# Patient Record
Sex: Male | Born: 1965 | Race: Black or African American | Hispanic: No | Marital: Married | Smoking: Current every day smoker
Health system: Southern US, Community
[De-identification: ages and names within clinical notes are randomized; demographics above are authoritative.]

---

## 2016-06-24 ENCOUNTER — Other Ambulatory Visit: Payer: Self-pay | Admitting: Family Medicine

## 2016-06-24 ENCOUNTER — Ambulatory Visit
Admission: RE | Admit: 2016-06-24 | Discharge: 2016-06-24 | Disposition: A | Discharge status: Home / Self Care | Payer: 59 | Source: Ambulatory Visit | Attending: Family Medicine | Admitting: Family Medicine

## 2016-06-24 DIAGNOSIS — M79604 Pain in right leg: Secondary | ICD-10-CM | POA: Diagnosis present

## 2016-06-24 DIAGNOSIS — M79605 Pain in left leg: Secondary | ICD-10-CM | POA: Diagnosis not present

## 2020-02-03 ENCOUNTER — Ambulatory Visit: Payer: 59

## 2022-01-31 ENCOUNTER — Other Ambulatory Visit: Payer: Self-pay | Admitting: *Deleted

## 2022-01-31 DIAGNOSIS — Z87891 Personal history of nicotine dependence: Secondary | ICD-10-CM

## 2022-01-31 DIAGNOSIS — F17210 Nicotine dependence, cigarettes, uncomplicated: Secondary | ICD-10-CM

## 2022-02-18 ENCOUNTER — Ambulatory Visit (INDEPENDENT_AMBULATORY_CARE_PROVIDER_SITE_OTHER): Payer: BC Managed Care – PPO | Admitting: Acute Care

## 2022-02-18 ENCOUNTER — Encounter: Payer: Self-pay | Admitting: Acute Care

## 2022-02-18 DIAGNOSIS — F17210 Nicotine dependence, cigarettes, uncomplicated: Secondary | ICD-10-CM | POA: Diagnosis not present

## 2022-02-18 NOTE — Patient Instructions (Signed)
Thank you for participating in the [facility]. It was our pleasure to meet you today. We will call you with the results of your scan within the next few days. Your scan will be assigned a Lung RADS category score by the physicians reading the scans.  This Lung RADS score determines follow up scanning.  See below for description of categories, and follow up screening recommendations. We will be in touch to schedule your follow up screening annually or based on recommendations of our providers. We will fax a copy of your scan results to your Primary Care Physician, or the physician who referred you to the program, to ensure they have the results. Please call the office if you have any questions or concerns regarding your scanning experience or results.  Our office number is [phone]. Please speak with Denise Phelps, RN. , or  Denise Buckner RN, They are  our Lung Cancer Screening RN.'s If They are unavailable when you call, Please leave a message on the voice mail. We will return your call at our earliest convenience.This voice mail is monitored several times a day.  Remember, if your scan is normal, we will scan you annually as long as you continue to meet the criteria for the program. (Age 55-77, Current smoker or smoker who has quit within the last 15 years). If you are a smoker, remember, quitting is the single most powerful action that you can take to decrease your risk of lung cancer and other pulmonary, breathing related problems. We know quitting is hard, and we are here to help.  Please let us know if there is anything we can do to help you meet your goal of quitting. If you are a former smoker, congratulations. We are proud of you! Remain smoke free! Remember you can refer friends or family members through the number above.  We will screen them to make sure they meet criteria for the program. Thank you for helping us take better care of you by  participating in Lung Screening.  You can receive free nicotine replacement therapy ( patches, gum or mints) by calling 1-800-QUIT NOW. Please call so we can get you on the path to becoming  a non-smoker. I know it is hard, but you can do this!  Lung RADS Categories:  Lung RADS 1: no nodules or definitely non-concerning nodules.  Recommendation is for a repeat annual scan in 12 months.  Lung RADS 2:  nodules that are non-concerning in appearance and behavior with a very low likelihood of becoming an active cancer. Recommendation is for a repeat annual scan in 12 months.  Lung RADS 3: nodules that are probably non-concerning , includes nodules with a low likelihood of becoming an active cancer.  Recommendation is for a 6-month repeat screening scan. Often noted after an upper respiratory illness. We will be in touch to make sure you have no questions, and to schedule your 6-month scan.  Lung RADS 4 A: nodules with concerning findings, recommendation is most often for a follow up scan in 3 months or additional testing based on our provider's assessment of the scan. We will be in touch to make sure you have no questions and to schedule the recommended 3 month follow up scan.  Lung RADS 4 B:  indicates findings that are concerning. We will be in touch with you to schedule additional diagnostic testing based on our provider's  assessment of the scan.  Other options for assistance in smoking cessation (   As covered by your insurance benefits)  Hypnosis for smoking cessation  [employer] [phone]  Acupuncture for smoking cessation  [facility] [phone]   

## 2022-02-18 NOTE — Progress Notes (Signed)
Virtual Visit via Telephone Note ? ?I connected with Justin Booth on 09/24/21 at  2:00 PM EST by telephone and verified that I am speaking with the correct person using two identifiers. ? ?Location: ?Patient: Home  ?Provider: Working from home ?  ?I discussed the limitations, risks, security and privacy concerns of performing an evaluation and management service by telephone and the availability of in person appointments. I also discussed with the patient that there may be a patient responsible charge related to this service. The patient expressed understanding and agreed to proceed. ? ?Shared Decision Making Visit Lung Cancer Screening Program ?[phone]) ? ? ?Eligibility: ?Age 56 y.o. ?Pack Years Smoking History Calculation 40 ?(# packs/per year x # years smoked) ?Recent History of coughing up blood  no ?Unexplained weight loss? no ?( >Than 15 pounds within the last 6 months ) ?Prior History Lung / other cancer no ?(Diagnosis within the last 5 years already requiring surveillance chest CT Scans). ?Smoking Status Current Smoker ?Former Smokers: Years since quit: NA ? Quit Date: NA ? ?Visit Components: ?Discussion included one or more decision making aids. yes ?Discussion included risk/benefits of screening. yes ?Discussion included potential follow up diagnostic testing for abnormal scans. yes ?Discussion included meaning and risk of over diagnosis. yes ?Discussion included meaning and risk of False Positives. yes ?Discussion included meaning of total radiation exposure. yes ? ?Counseling Included: ?Importance of adherence to annual lung cancer LDCT screening. yes ?Impact of comorbidities on ability to participate in the program. yes ?Ability and willingness to under diagnostic treatment. yes ? ?Smoking Cessation Counseling: ?Current Smokers:  ?Discussed importance of smoking cessation. yes ?Information about tobacco cessation classes and interventions provided to patient. yes ?Patient provided with "ticket" for LDCT  Scan. yes ?Symptomatic Patient. yes ? Counseling(Intermediate counseling: > three minutes) 99406 ?Diagnosis Code: Tobacco Use Z72.0 ?Asymptomatic Patient no ? Counseling NA ?Former Smokers:  ?Discussed the importance of maintaining cigarette abstinence. yes ?Diagnosis Code: Personal History of Nicotine Dependence. U54.270 ?Information about tobacco cessation classes and interventions provided to patient. Yes ?Patient provided with "ticket" for LDCT Scan. yes ?Written Order for Lung Cancer Screening with LDCT placed in Epic. Yes ?(CT Chest Lung Cancer Screening Low Dose W/O CM) [id number] ?Z12.2-Screening of respiratory organs ?Z87.891-Personal history of nicotine dependence ? ? ?I spent 25 minutes of face to face time with him discussing the risks and benefits of lung cancer screening. We viewed a power point together that explained in detail the above noted topics. We took the time to pause the power point at intervals to allow for questions to be asked and answered to ensure understanding. We discussed that he had taken the single most powerful action possible to decrease his risk of developing lung cancer when he quit smoking. I counseled him to remain smoke free, and to contact me if he ever had the desire to smoke again so that I can provide resources and tools to help support the effort to remain smoke free. We discussed the time and location of the scan, and that either  Justin Miyamoto RN or I will call with the results within  24-48 hours of receiving them. He has my card and contact information in the event he needs to speak with me, in addition to a copy of the power point we reviewed as a resource. He verbalized understanding of all of the above and had no further questions upon leaving the office.  ? ? ? ?I explained to the patient that there has been a  high incidence of coronary artery disease noted on these exams. I explained that this is a non-gated exam therefore degree or severity cannot be determined.  This patient is not on statin therapy. I have asked the patient to follow-up with their PCP regarding any incidental finding of coronary artery disease and management with diet or medication as they feel is clinically indicated. The patient verbalized understanding of the above and had no further questions. ? ? ?I spent 3 minutes counseling on smoking cessation and the health risks of continued tobacco abuse  ? ? ?Zamyia Gowell D. Harris, NP-C ?[facility] ?Personal contact information can be found on Amion  ?02/18/2022, 9:17 AM ? ? ? ? ? ? ? ? ? ?

## 2022-02-19 ENCOUNTER — Ambulatory Visit: Payer: BC Managed Care – PPO

## 2022-03-04 ENCOUNTER — Ambulatory Visit
Admission: RE | Admit: 2022-03-04 | Discharge: 2022-03-04 | Disposition: A | Discharge status: Home / Self Care | Payer: BC Managed Care – PPO | Source: Ambulatory Visit | Attending: Acute Care | Admitting: Acute Care

## 2022-03-04 ENCOUNTER — Other Ambulatory Visit: Payer: Self-pay

## 2022-03-04 DIAGNOSIS — Z87891 Personal history of nicotine dependence: Secondary | ICD-10-CM | POA: Insufficient documentation

## 2022-03-04 DIAGNOSIS — F17210 Nicotine dependence, cigarettes, uncomplicated: Secondary | ICD-10-CM | POA: Diagnosis present

## 2022-03-05 ENCOUNTER — Telehealth: Payer: Self-pay | Admitting: Family Medicine

## 2022-03-05 DIAGNOSIS — Z122 Encounter for screening for malignant neoplasm of respiratory organs: Secondary | ICD-10-CM

## 2022-03-05 DIAGNOSIS — Z87891 Personal history of nicotine dependence: Secondary | ICD-10-CM

## 2022-03-05 DIAGNOSIS — F17210 Nicotine dependence, cigarettes, uncomplicated: Secondary | ICD-10-CM

## 2022-03-06 NOTE — Telephone Encounter (Signed)
Pt informed of CT results per Sarah Groce, NP.  PT verbalized understanding.  Copy of CT sent to PCP.  Order placed for 1 yr f/u CT. ° °

## 2023-02-16 ENCOUNTER — Other Ambulatory Visit: Payer: Self-pay | Admitting: *Deleted

## 2023-02-16 DIAGNOSIS — F17210 Nicotine dependence, cigarettes, uncomplicated: Secondary | ICD-10-CM

## 2023-02-16 DIAGNOSIS — Z122 Encounter for screening for malignant neoplasm of respiratory organs: Secondary | ICD-10-CM

## 2023-02-16 DIAGNOSIS — Z87891 Personal history of nicotine dependence: Secondary | ICD-10-CM

## 2023-03-06 ENCOUNTER — Ambulatory Visit: Payer: BC Managed Care – PPO

## 2023-03-07 IMAGING — CT CT CHEST LUNG CANCER SCREENING LOW DOSE W/O CM
2 of 5 series · 15 of 40 positions shown, 18 images · non-contrast
Comparison: None.

CLINICAL DATA: Current smoker, 40 pack-year history.



[Series 3: lung 1.00 · axial · 0.96mm/px · z∈[-1236,-883]mm · 12 of 389 slices shown, 15 images]
[im 18/389  mediastinal]
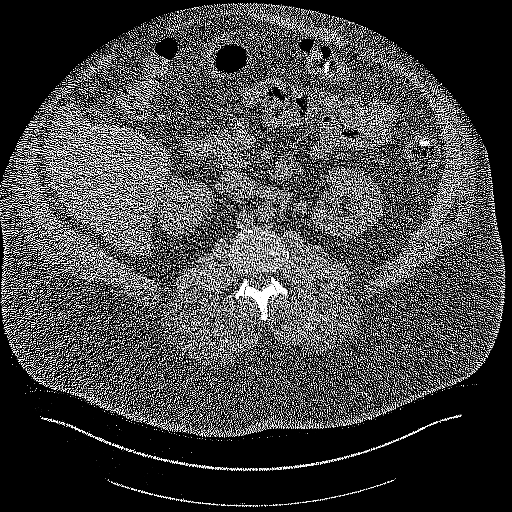
[im 18/389  lung]
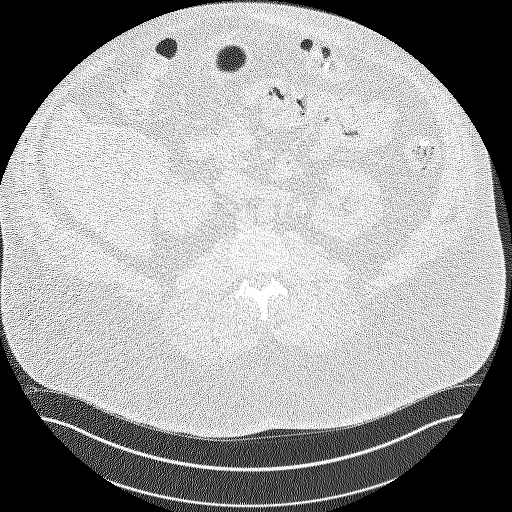
[im 53/389  lung]
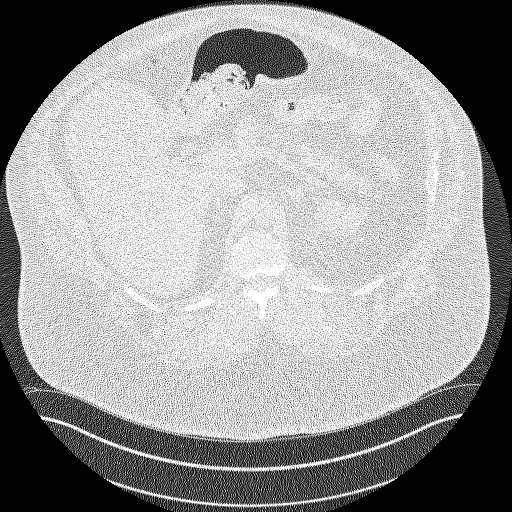
[im 89/389  lung]
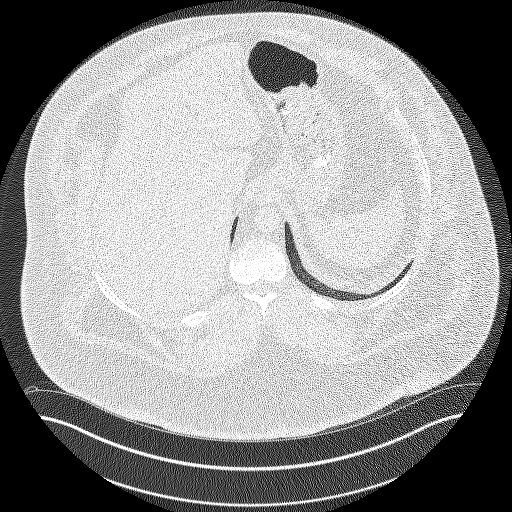
[im 124/389  lung]
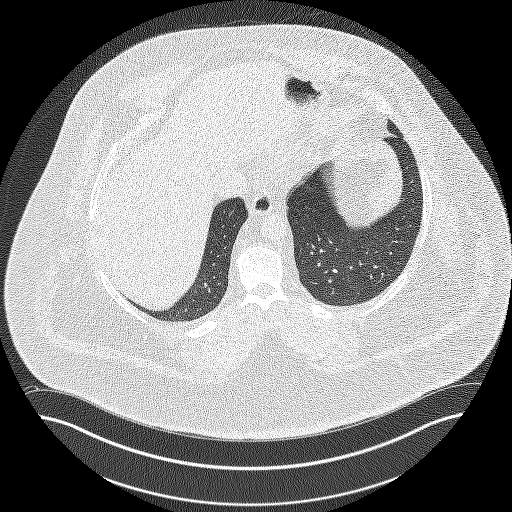
[im 142/389  mediastinal]
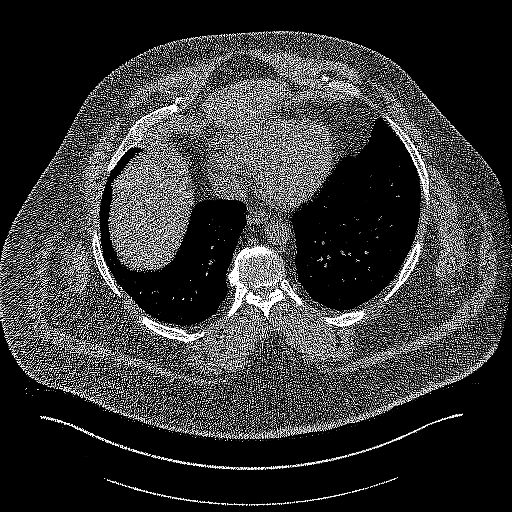
[im 142/389  lung]
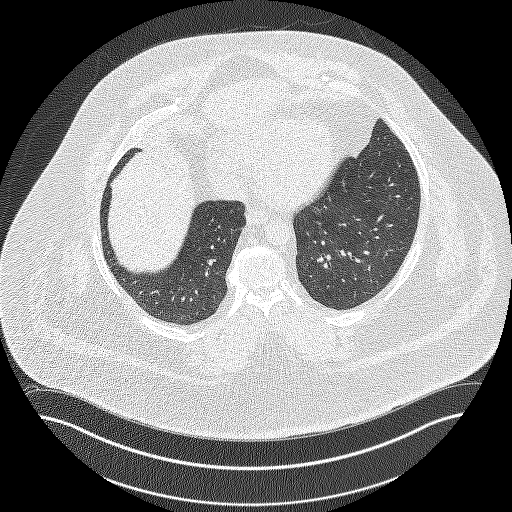
[im 177/389  lung]
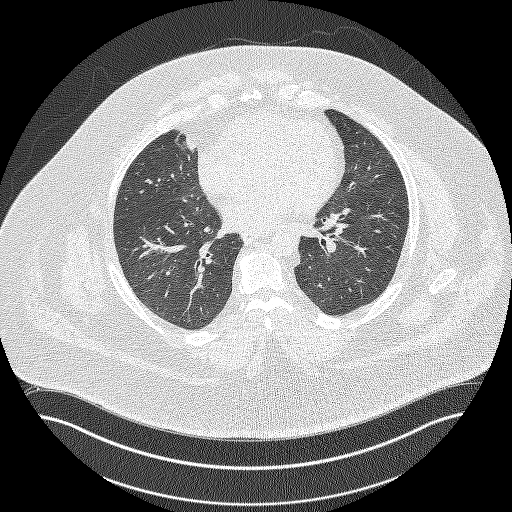
[im 212/389  lung]
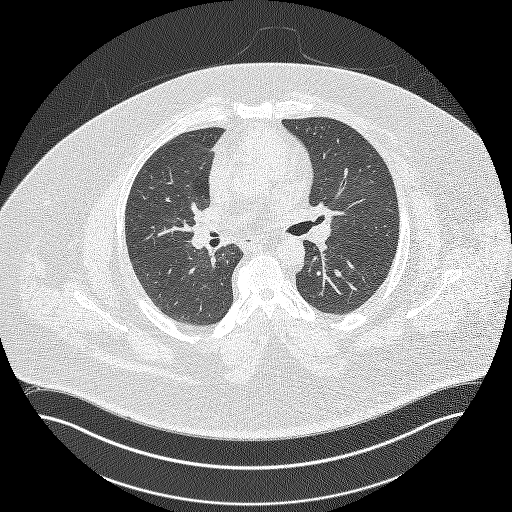
[im 247/389  lung]
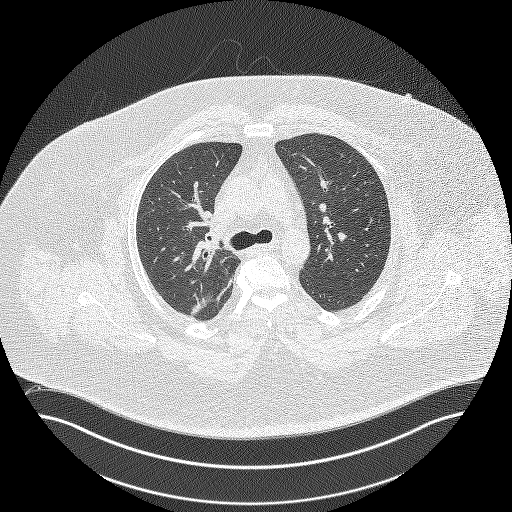
[im 265/389  mediastinal]
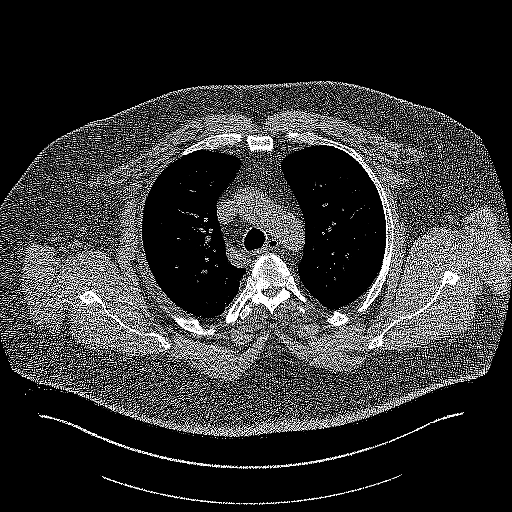
[im 265/389  lung]
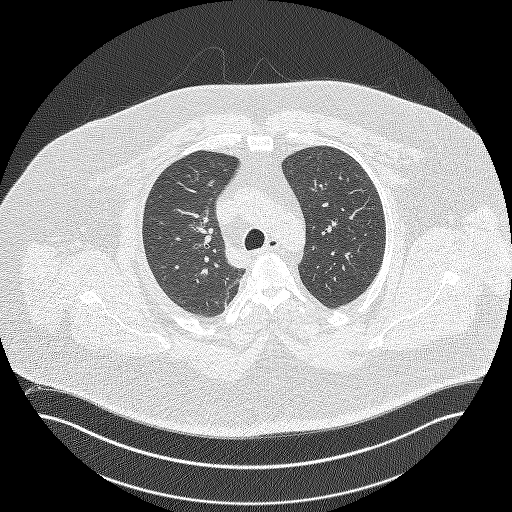
[im 300/389  lung]
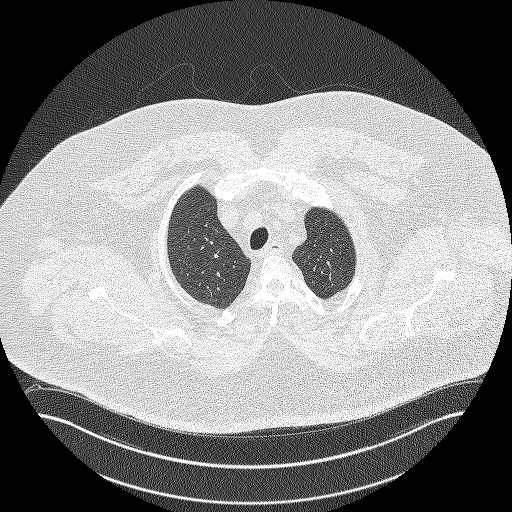
[im 336/389  lung]
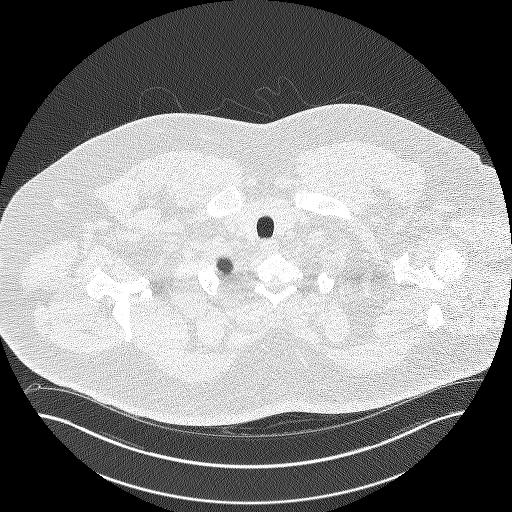
[im 371/389  lung]
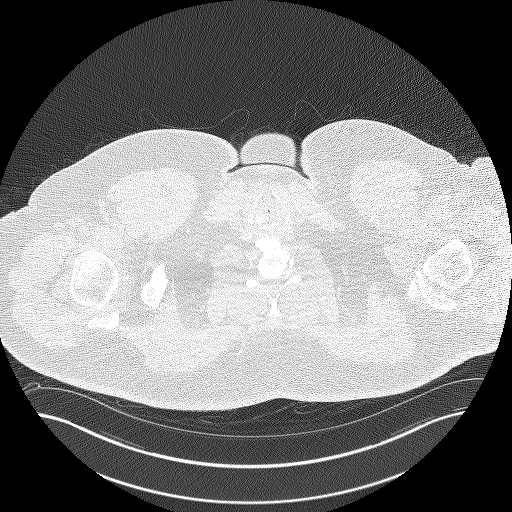

[Series 5: coronals lung 1.00 cor · coronal · 0.76mm/px · 3 of 489 slices shown]
[im 98/489  lung]
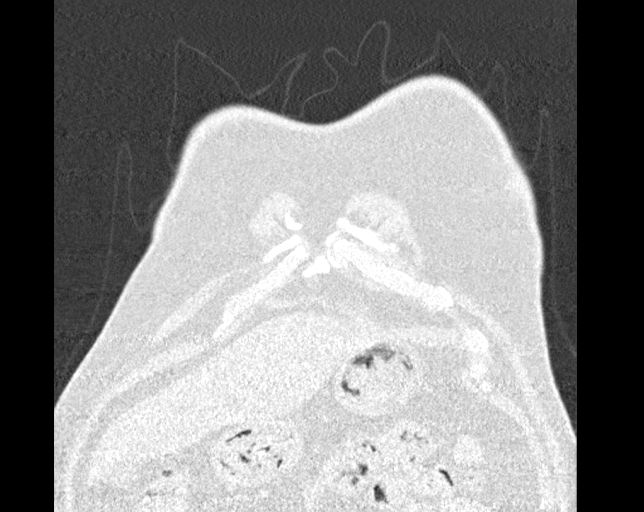
[im 196/489  lung]
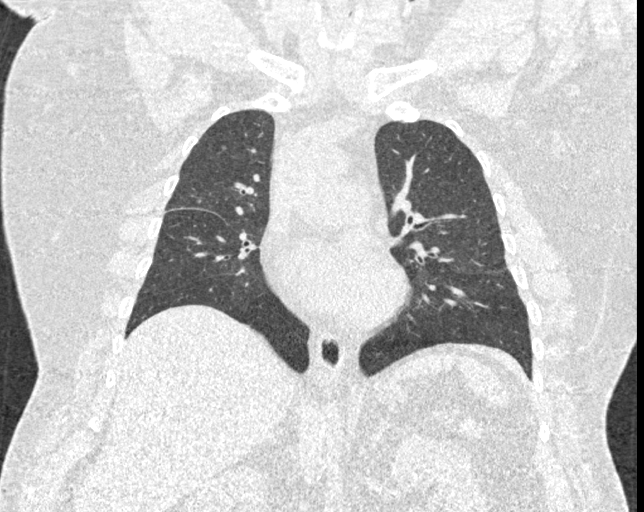
[im 293/489  lung]
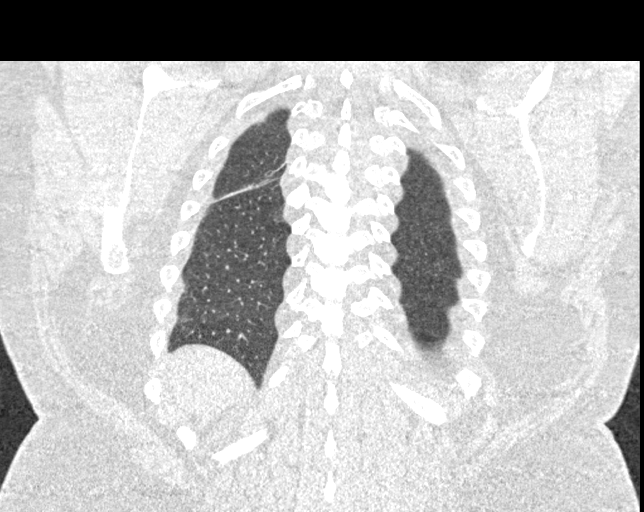

[15 of 40 positions shown; findings below may reference images not displayed]

FINDINGS: Cardiovascular: Atherosclerotic calcification of the aorta. Heart
size normal. No pericardial effusion.

Mediastinum/Nodes: No pathologically enlarged mediastinal or
axillary lymph nodes. Hilar regions are difficult to definitively
evaluate without IV contrast. Esophagus is grossly unremarkable.

Lungs/Pleura: Centrilobular emphysema. Smoking related respiratory
bronchiolitis. Scattered subsegmental volume loss. No pleural fluid.
Airway is unremarkable.

Upper Abdomen: Visualized portions of the liver, gallbladder,
adrenal glands and right kidney are unremarkable. 2.1 cm fluid
density lesion in the left kidney, indicative of a cyst. No
follow-up necessary. Spleen and visualized portions of the pancreas,
stomach and bowel are unremarkable.

Musculoskeletal: Degenerative changes in the spine. No worrisome
lytic or sclerotic lesions.
IMPRESSION: 1. Lung-RADS 1, negative. Continue annual screening with low-dose
chest CT without contrast in 12 months.
2.  Aortic atherosclerosis ([id number]).
3.  Emphysema ([id number]).

## 2023-03-18 ENCOUNTER — Ambulatory Visit
Admission: RE | Admit: 2023-03-18 | Discharge: 2023-03-18 | Disposition: A | Discharge status: Home / Self Care | Payer: BC Managed Care – PPO | Source: Ambulatory Visit | Attending: Acute Care | Admitting: Acute Care

## 2023-03-18 DIAGNOSIS — F17210 Nicotine dependence, cigarettes, uncomplicated: Secondary | ICD-10-CM | POA: Diagnosis not present

## 2023-03-18 DIAGNOSIS — Z122 Encounter for screening for malignant neoplasm of respiratory organs: Secondary | ICD-10-CM | POA: Insufficient documentation

## 2023-03-18 DIAGNOSIS — Z87891 Personal history of nicotine dependence: Secondary | ICD-10-CM | POA: Insufficient documentation

## 2023-03-23 ENCOUNTER — Other Ambulatory Visit: Payer: Self-pay | Admitting: Acute Care

## 2023-03-23 DIAGNOSIS — Z122 Encounter for screening for malignant neoplasm of respiratory organs: Secondary | ICD-10-CM

## 2023-03-23 DIAGNOSIS — Z87891 Personal history of nicotine dependence: Secondary | ICD-10-CM

## 2023-03-23 DIAGNOSIS — F17210 Nicotine dependence, cigarettes, uncomplicated: Secondary | ICD-10-CM

## 2024-08-04 ENCOUNTER — Other Ambulatory Visit: Payer: Self-pay | Admitting: Acute Care

## 2024-08-04 DIAGNOSIS — Z87891 Personal history of nicotine dependence: Secondary | ICD-10-CM

## 2024-08-04 DIAGNOSIS — Z122 Encounter for screening for malignant neoplasm of respiratory organs: Secondary | ICD-10-CM

## 2024-08-04 DIAGNOSIS — F17210 Nicotine dependence, cigarettes, uncomplicated: Secondary | ICD-10-CM

## 2024-12-15 ENCOUNTER — Ambulatory Visit

## 2024-12-20 ENCOUNTER — Ambulatory Visit
